# Patient Record
Sex: Male | Born: 1995 | Race: White | Hispanic: No | Marital: Single | Smoking: Never smoker
Health system: Southern US, Community
[De-identification: ages and names within clinical notes are randomized; demographics above are authoritative.]

---
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
## 2015-04-27 ENCOUNTER — Other Ambulatory Visit: Payer: Self-pay | Admitting: Obstetrics and Gynecology

## 2015-05-17 ENCOUNTER — Other Ambulatory Visit: Payer: Self-pay | Admitting: Obstetrics and Gynecology

## 2015-06-06 ENCOUNTER — Ambulatory Visit: Admit: 2015-06-06 | Payer: BLUE CROSS/BLUE SHIELD | Admitting: Obstetrics and Gynecology

## 2015-06-06 SURGERY — LIGATION, FALLOPIAN TUBE, LAPAROSCOPIC
Anesthesia: Choice | Laterality: Bilateral

## 2016-08-07 IMAGING — DX DG ANKLE COMPLETE 3+V*L*
3 series · 3 of 3 positions shown · non-contrast
Comparison: None.

CLINICAL DATA: Acute left ankle injury, pain and swelling. Initial
encounter.

EXAM:
LEFT ANKLE COMPLETE - 3+ VIEW

[ankle ap]
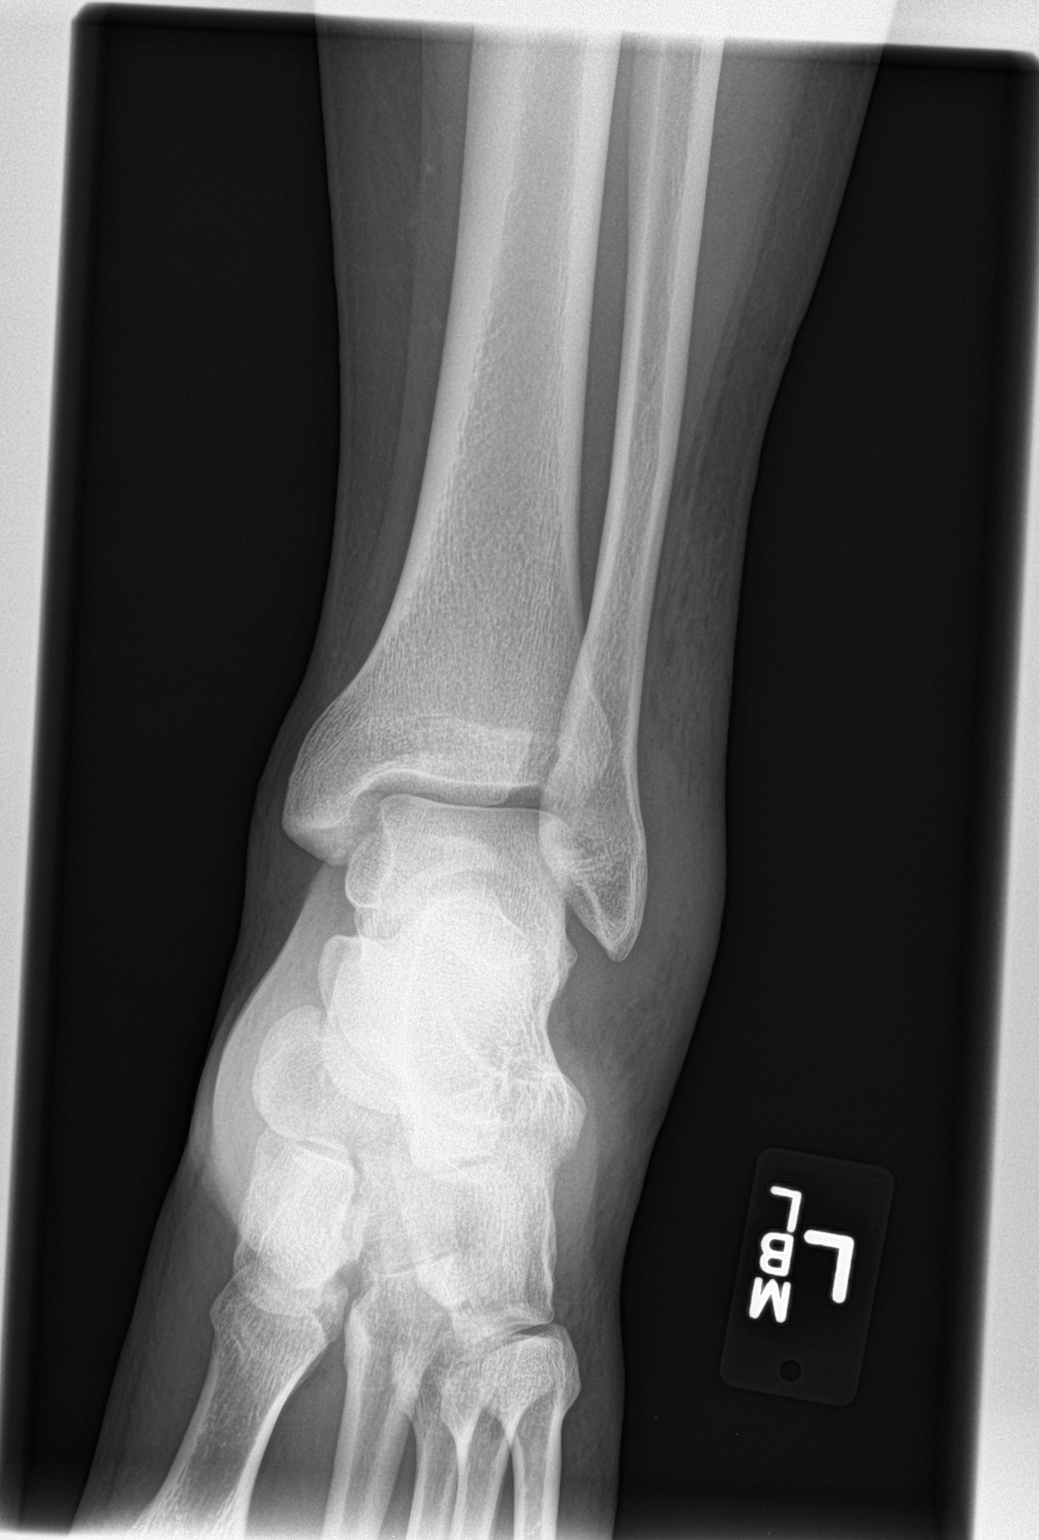

[ankle obl]
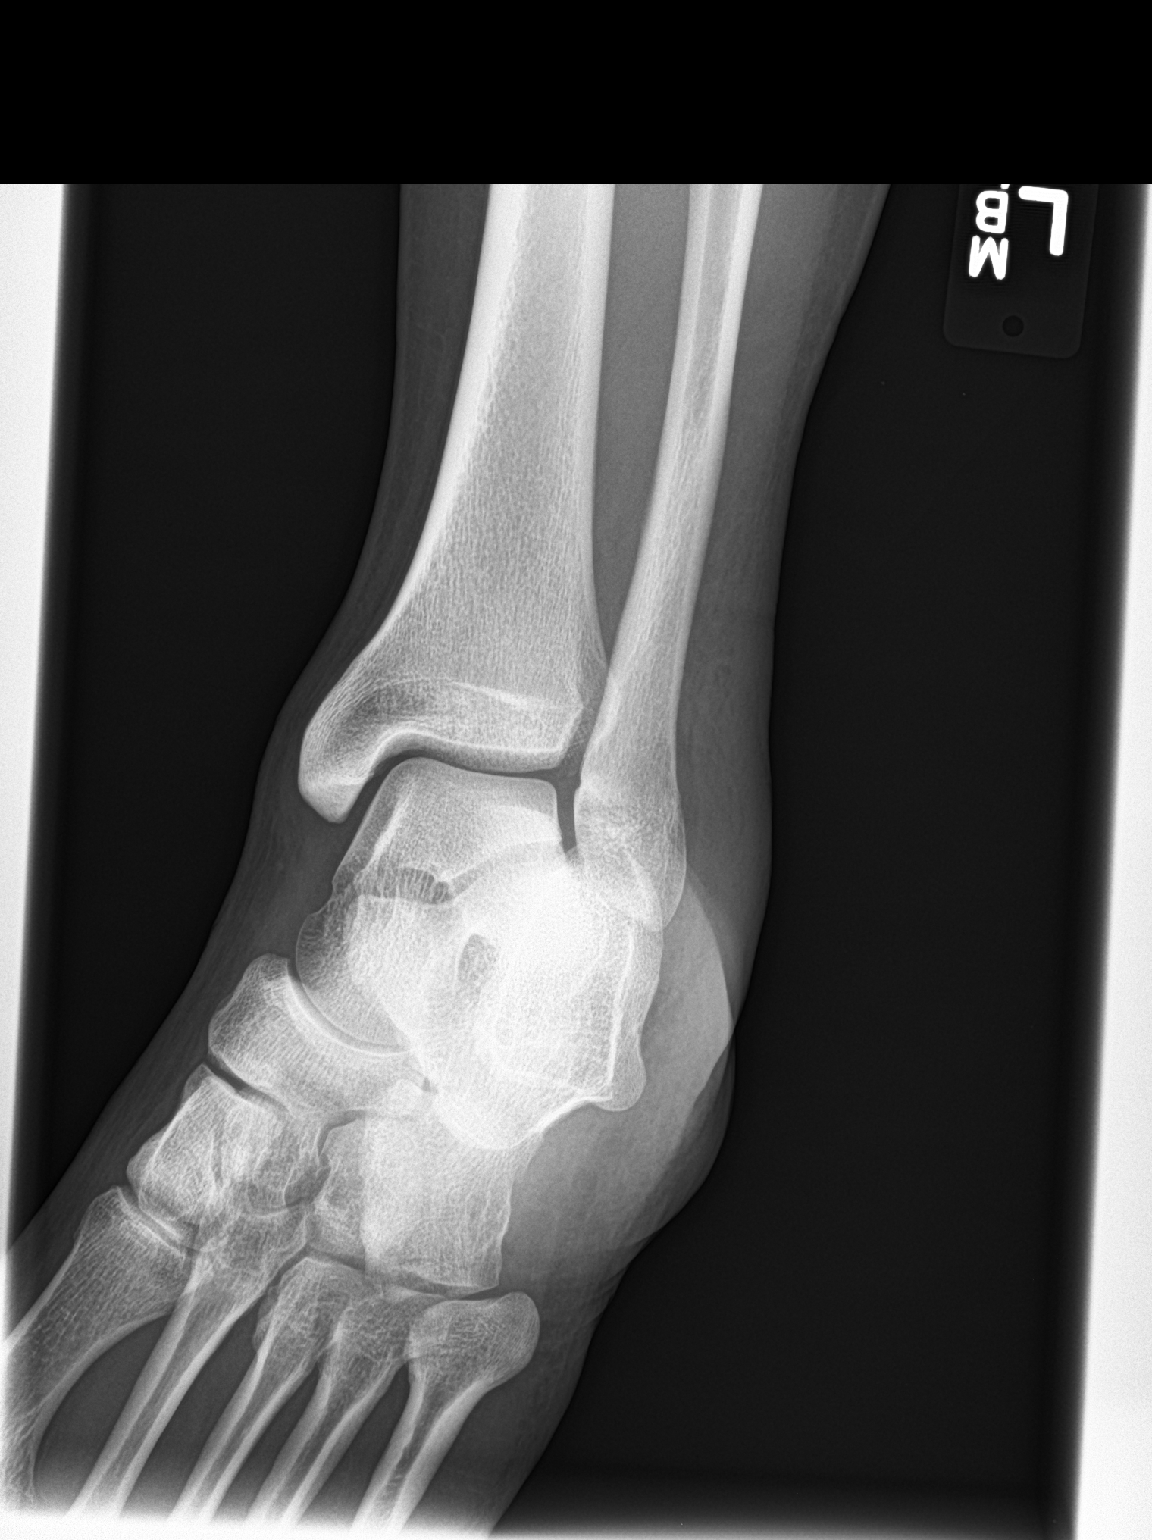

[ankle lat]
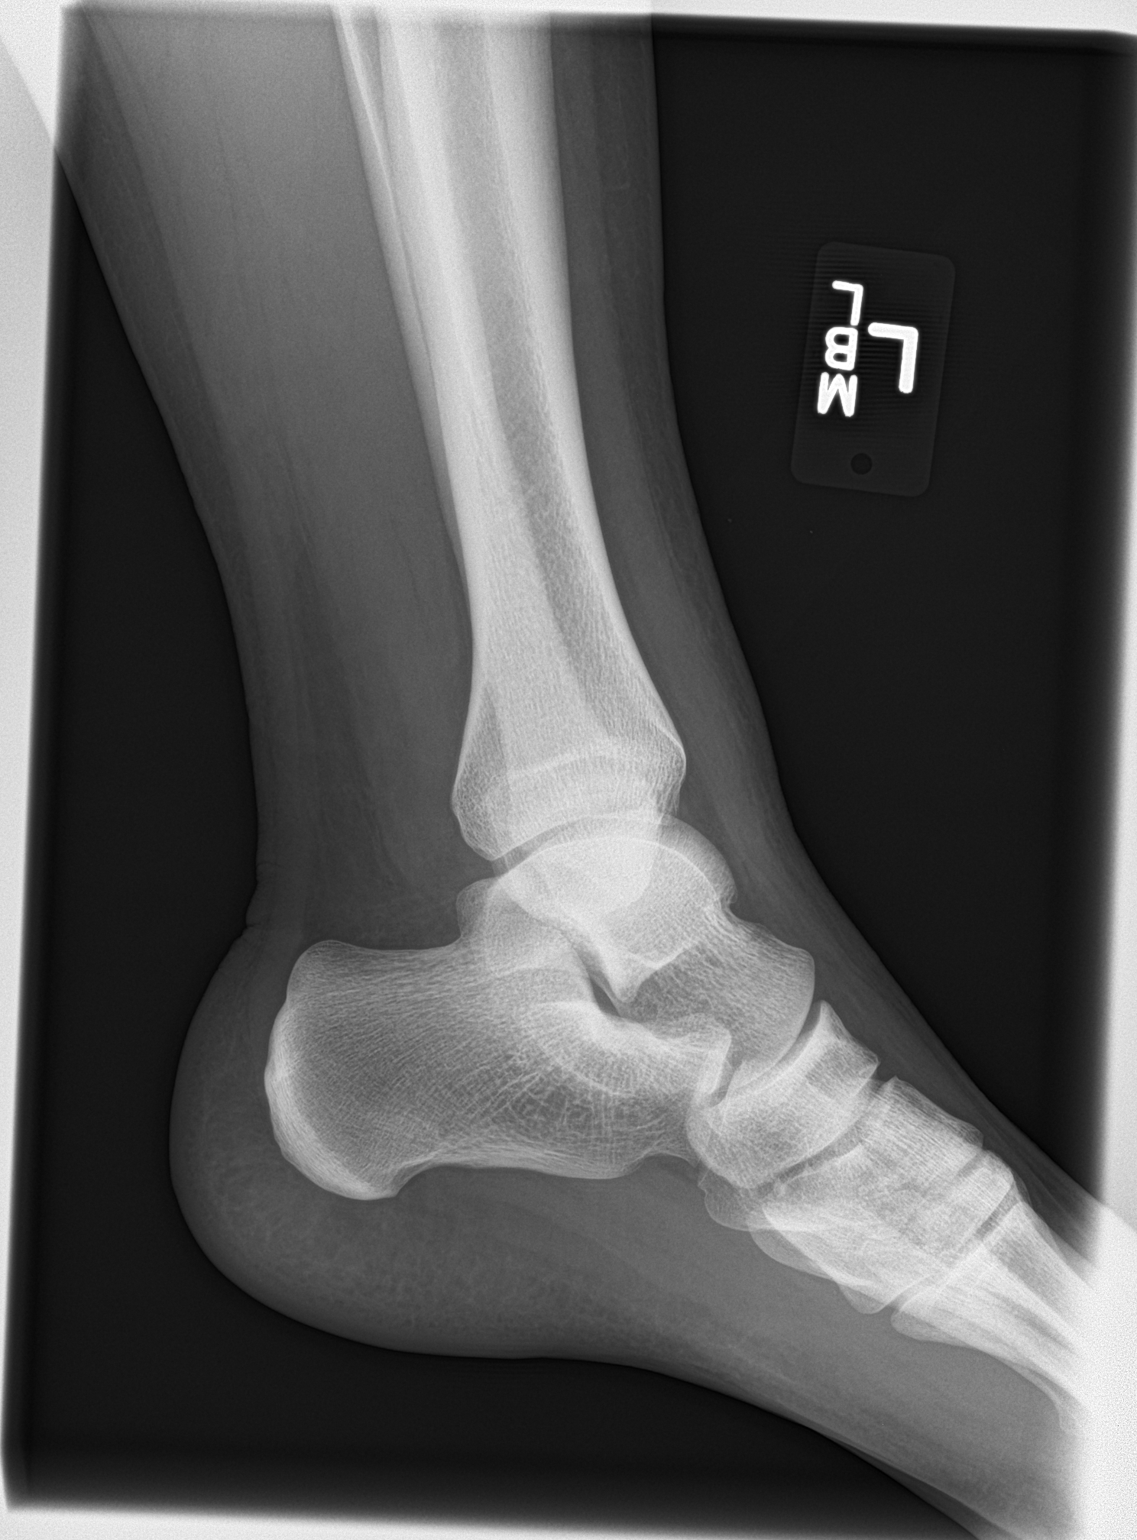

[3 of 3 positions shown; findings below may reference images not displayed]

FINDINGS: There is no evidence of fracture, subluxation or dislocation.

Lateral soft tissue swelling is noted.

The ankle mortise is intact.

No focal bony lesions identified.
IMPRESSION: Soft tissue swelling without acute bony abnormality.
